# Patient Record
Sex: Female | Born: 1980 | Race: White | Hispanic: No | Marital: Single | Smoking: Former smoker
Health system: Southern US, Community
[De-identification: ages and names within clinical notes are randomized; demographics above are authoritative.]

## PROBLEM LIST (undated history)

## (undated) DIAGNOSIS — F32A Depression, unspecified: Secondary | ICD-10-CM

## (undated) DIAGNOSIS — F329 Major depressive disorder, single episode, unspecified: Secondary | ICD-10-CM

## (undated) DIAGNOSIS — F419 Anxiety disorder, unspecified: Secondary | ICD-10-CM

## (undated) DIAGNOSIS — F319 Bipolar disorder, unspecified: Secondary | ICD-10-CM

## (undated) DIAGNOSIS — F410 Panic disorder [episodic paroxysmal anxiety] without agoraphobia: Secondary | ICD-10-CM

## (undated) HISTORY — PX: TUBAL LIGATION: SHX77

---

## 2009-07-04 ENCOUNTER — Emergency Department (HOSPITAL_BASED_OUTPATIENT_CLINIC_OR_DEPARTMENT_OTHER): Admission: EM | Admit: 2009-07-04 | Discharge: 2009-07-04 | Payer: Self-pay | Admitting: Emergency Medicine

## 2009-07-04 ENCOUNTER — Ambulatory Visit: Payer: Self-pay | Admitting: Diagnostic Radiology

## 2010-01-02 ENCOUNTER — Emergency Department (HOSPITAL_BASED_OUTPATIENT_CLINIC_OR_DEPARTMENT_OTHER): Admission: EM | Admit: 2010-01-02 | Discharge: 2010-01-02 | Payer: Self-pay | Admitting: Emergency Medicine

## 2010-01-24 ENCOUNTER — Emergency Department (HOSPITAL_BASED_OUTPATIENT_CLINIC_OR_DEPARTMENT_OTHER): Admission: EM | Admit: 2010-01-24 | Discharge: 2010-01-24 | Payer: Self-pay | Admitting: Emergency Medicine

## 2010-01-27 ENCOUNTER — Emergency Department (HOSPITAL_BASED_OUTPATIENT_CLINIC_OR_DEPARTMENT_OTHER): Admission: EM | Admit: 2010-01-27 | Discharge: 2010-01-27 | Payer: Self-pay | Admitting: Emergency Medicine

## 2010-03-01 ENCOUNTER — Emergency Department (HOSPITAL_BASED_OUTPATIENT_CLINIC_OR_DEPARTMENT_OTHER): Admission: EM | Admit: 2010-03-01 | Discharge: 2010-03-01 | Payer: Self-pay | Admitting: Emergency Medicine

## 2010-04-08 ENCOUNTER — Emergency Department (HOSPITAL_BASED_OUTPATIENT_CLINIC_OR_DEPARTMENT_OTHER): Admission: EM | Admit: 2010-04-08 | Discharge: 2010-04-08 | Payer: Self-pay | Admitting: Emergency Medicine

## 2010-04-11 ENCOUNTER — Emergency Department (HOSPITAL_BASED_OUTPATIENT_CLINIC_OR_DEPARTMENT_OTHER): Admission: EM | Admit: 2010-04-11 | Discharge: 2010-04-11 | Payer: Self-pay | Admitting: Emergency Medicine

## 2010-07-21 ENCOUNTER — Emergency Department (HOSPITAL_BASED_OUTPATIENT_CLINIC_OR_DEPARTMENT_OTHER)
Admission: EM | Admit: 2010-07-21 | Discharge: 2010-07-21 | Payer: Self-pay | Source: Home / Self Care | Admitting: Emergency Medicine

## 2011-03-14 ENCOUNTER — Emergency Department (HOSPITAL_BASED_OUTPATIENT_CLINIC_OR_DEPARTMENT_OTHER)
Admission: EM | Admit: 2011-03-14 | Discharge: 2011-03-14 | Disposition: A | Discharge status: Home / Self Care | Payer: Medicaid Other | Attending: Emergency Medicine | Admitting: Emergency Medicine

## 2011-03-14 ENCOUNTER — Other Ambulatory Visit: Payer: Self-pay

## 2011-03-14 DIAGNOSIS — R079 Chest pain, unspecified: Secondary | ICD-10-CM | POA: Insufficient documentation

## 2011-03-14 DIAGNOSIS — F17200 Nicotine dependence, unspecified, uncomplicated: Secondary | ICD-10-CM | POA: Insufficient documentation

## 2011-03-14 DIAGNOSIS — F411 Generalized anxiety disorder: Secondary | ICD-10-CM

## 2011-03-14 DIAGNOSIS — F319 Bipolar disorder, unspecified: Secondary | ICD-10-CM | POA: Insufficient documentation

## 2011-03-14 HISTORY — DX: Anxiety disorder, unspecified: F41.9

## 2011-03-14 HISTORY — DX: Major depressive disorder, single episode, unspecified: F32.9

## 2011-03-14 HISTORY — DX: Bipolar disorder, unspecified: F31.9

## 2011-03-14 HISTORY — DX: Depression, unspecified: F32.A

## 2011-03-14 MED ORDER — ALPRAZOLAM 1 MG PO TABS: 1.0000 mg | ORAL_TABLET | Freq: Every evening | ORAL | Status: AC | PRN

## 2011-03-14 NOTE — ED Notes (Signed)
Hx of anxiety and has been treated in [facility] and were released from there 2-3 months ago for family problems; therefore stopped all meds; since then, feels impending doom; some chest pain; anxiety; was taking meds until then; states intermiitent chest pain with increased anxiety

## 2011-03-14 NOTE — ED Provider Notes (Signed)
History     CSN: [id number] Arrival date & time: 03/14/2011 12:20 PM  Chief Complaint  Patient presents with  . Anxiety   HPI Comments: Pt state that she has a history of being on seroquel and xanax, but has not been on them for a couple of months:pt states that she needs to get set back up with [facility]:pt denies si/hi:pt states that she has episodes of cp when she gets the anxiety sensation  Patient is a 30 y.o. female presenting with anxiety. The history is provided by the patient.  Anxiety This is a recurrent problem. The current episode started 1 to 4 weeks ago. The problem occurs 2 to 4 times per day. The problem has been gradually worsening. Associated symptoms include chest pain. Pertinent negatives include no fever, headaches, rash, sore throat, visual change, vomiting or weakness. The symptoms are aggravated by stress. She has tried nothing for the symptoms.    Past Medical History  Diagnosis Date  . Anxiety   . Depression   . Bipolar affective disorder     History reviewed. No pertinent past surgical history.  No family history on file.  History  Substance Use Topics  . Smoking status: Current Everyday Smoker  . Smokeless tobacco: Never Used  . Alcohol Use: No     former heavy drinker at times    OB History    Grav Para Term Preterm Abortions TAB SAB Ect Mult Living                  Review of Systems  Constitutional: Negative for fever.  HENT: Negative for sore throat.   Cardiovascular: Positive for chest pain.  Gastrointestinal: Negative for vomiting.  Skin: Negative for rash.  Neurological: Negative for weakness and headaches.  All other systems reviewed and are negative.    Physical Exam  BP 113/75  Pulse 64  Temp(Src) 98.1 F (36.7 C) (Oral)  Resp 18  SpO2 100%  Physical Exam  Vitals reviewed. Constitutional: She appears well-developed and well-nourished.  HENT:  Head: Normocephalic.  Neck: Normal range of motion.  Cardiovascular: Normal  rate and regular rhythm.   Pulmonary/Chest: Effort normal and breath sounds normal.  Neurological: She is alert.  Skin: Skin is warm and dry.  Psychiatric: Her mood appears anxious.    ED Course  Procedures  MDM Pt not having hi/si:pt is okay to follow up with her pcp for continued treatment:agreed with pt that would give some xanax today, but would not continue to do it      Teressa Lower, NP 03/14/11 1331

## 2011-03-25 NOTE — ED Provider Notes (Signed)
Medical screening examination/treatment/procedure(s) were performed by non-physician practitioner and as supervising physician I was immediately available for consultation/collaboration.   Cyndra Numbers, MD 03/25/11 [phone]

## 2011-06-01 ENCOUNTER — Emergency Department (HOSPITAL_COMMUNITY)
Admission: EM | Admit: 2011-06-01 | Discharge: 2011-06-01 | Disposition: A | Discharge status: Home / Self Care | Payer: Medicaid Other | Attending: Emergency Medicine | Admitting: Emergency Medicine

## 2011-06-01 DIAGNOSIS — F411 Generalized anxiety disorder: Secondary | ICD-10-CM | POA: Insufficient documentation

## 2011-06-01 DIAGNOSIS — G4700 Insomnia, unspecified: Secondary | ICD-10-CM | POA: Insufficient documentation

## 2011-06-01 LAB — BASIC METABOLIC PANEL
Chloride: 103 mEq/L (ref 96–112)
GFR calc Af Amer: >90 mL/min (ref >90)
Potassium: 4.5 mEq/L (ref 3.5–5.1)
Sodium: 137 mEq/L (ref 135–145)

## 2011-06-01 LAB — URINALYSIS, ROUTINE W REFLEX MICROSCOPIC
Glucose, UA: NEGATIVE mg/dL
Ketones, ur: NEGATIVE mg/dL
Protein, ur: NEGATIVE mg/dL

## 2011-06-01 LAB — POCT PREGNANCY, URINE: Preg Test, Ur: NEGATIVE

## 2011-06-01 LAB — DIFFERENTIAL
Basophils Absolute: 0 10*3/uL (ref 0.0–0.1)
Eosinophils Absolute: 0.2 10*3/uL (ref 0.0–0.7)
Lymphs Abs: 1.9 10*3/uL (ref 0.7–4.0)
Neutrophils Relative %: 56 % (ref 43–77)

## 2011-06-01 LAB — URINE MICROSCOPIC-ADD ON

## 2011-06-01 LAB — RAPID URINE DRUG SCREEN, HOSP PERFORMED
Amphetamines: NOT DETECTED
Benzodiazepines: POSITIVE — AB
Opiates: NOT DETECTED

## 2011-06-01 LAB — CBC
MCV: 85.5 fL (ref 78.0–100.0)
Platelets: 202 10*3/uL (ref 150–400)
RBC: 4.47 MIL/uL (ref 3.87–5.11)
WBC: 6.1 10*3/uL (ref 4.0–10.5)

## 2011-06-01 LAB — ETHANOL: Alcohol, Ethyl (B): <11 mg/dL (ref 0–11)

## 2011-09-29 ENCOUNTER — Emergency Department (HOSPITAL_BASED_OUTPATIENT_CLINIC_OR_DEPARTMENT_OTHER)
Admission: EM | Admit: 2011-09-29 | Discharge: 2011-09-29 | Disposition: A | Discharge status: Home / Self Care | Payer: 59 | Attending: Emergency Medicine | Admitting: Emergency Medicine

## 2011-09-29 ENCOUNTER — Encounter (HOSPITAL_BASED_OUTPATIENT_CLINIC_OR_DEPARTMENT_OTHER): Payer: Self-pay | Admitting: *Deleted

## 2011-09-29 DIAGNOSIS — F419 Anxiety disorder, unspecified: Secondary | ICD-10-CM

## 2011-09-29 DIAGNOSIS — F411 Generalized anxiety disorder: Secondary | ICD-10-CM | POA: Insufficient documentation

## 2011-09-29 DIAGNOSIS — F319 Bipolar disorder, unspecified: Secondary | ICD-10-CM | POA: Insufficient documentation

## 2011-09-29 MED ORDER — ALPRAZOLAM 1 MG PO TABS: 1.0000 mg | ORAL_TABLET | Freq: Three times a day (TID) | ORAL | Status: DC | PRN

## 2011-09-29 NOTE — ED Provider Notes (Signed)
History   This chart was scribed for Toy Baker, MD scribed by Magnus Sinning. The patient was seen in room MH12/MH12 seen at 1515     CSN: [id number]  Arrival date & time 09/29/11  1427   First MD Initiated Contact with Patient 09/29/11 1506      Chief Complaint  Patient presents with  . Anxiety    (Consider location/radiation/quality/duration/timing/severity/associated sxs/prior treatment) HPI Christy Baker is a 31 y.o. female who presents to the Emergency Department complaining of constant severe feelings of  anxiety with associated feelings of paranoia,heart palpitations, and insomnia onset a week and a half ago. She reports that it is a 8 out of 10 in quality and denies SI,HI, auditory/ visual hallucinations, or feelings of depression. She says that she is constantly worried that someone will harm her or her children and that something bad will happen. She adds that she use to take 1 mg Xanax about 3 months ago, but stopped taking it. She says that she has decreased her caffeine consumption,but has had one bottle of pepsi and two cups of coke today. Denies any etoh or drug use.   PCP: Does not currently have a PCP.   Past Medical History  Diagnosis Date  . Anxiety   . Depression   . Bipolar affective disorder     History reviewed. No pertinent past surgical history.  History reviewed. No pertinent family history.  History  Substance Use Topics  . Smoking status: Current Everyday Smoker  . Smokeless tobacco: Never Used  . Alcohol Use: No     former heavy drinker at times   Review of Systems  Cardiovascular: Positive for palpitations.  Psychiatric/Behavioral: Negative for suicidal ideas and hallucinations. The patient is nervous/anxious.   All other systems reviewed and are negative.    Allergies  Review of patient's allergies indicates no known allergies.  Home Medications   Current Outpatient Rx  Name Route Sig Dispense Refill  . ALPRAZOLAM 1 MG PO TABS  Oral Take 1 mg by mouth at bedtime as needed.      Marland Kitchen QUETIAPINE FUMARATE 100 MG PO TABS Oral Take 100 mg by mouth at bedtime.        BP 128/77  Pulse 91  Temp(Src) 98.7 F (37.1 C) (Oral)  Resp 18  Ht 5\' 1"  (1.549 m)  Wt 123 lb (55.792 kg)  BMI 23.24 kg/m2  SpO2 100%  LMP 09/13/2011  Physical Exam  Nursing note and vitals reviewed. Constitutional: She is oriented to person, place, and time. She appears well-developed and well-nourished. No distress.  HENT:  Head: Normocephalic and atraumatic.  Eyes: EOM are normal. Pupils are equal, round, and reactive to light.  Neck: Neck supple. No tracheal deviation present.  Cardiovascular: Tachycardia present.   Pulmonary/Chest: Effort normal. No respiratory distress.  Abdominal: Soft. She exhibits no distension.  Musculoskeletal: Normal range of motion. She exhibits no edema.  Neurological: She is alert and oriented to person, place, and time. No sensory deficit.  Skin: Skin is warm and dry.  Psychiatric: Her mood appears anxious. She is not actively hallucinating. She expresses no homicidal and no suicidal ideation.    ED Course  Procedures (including critical care time) DIAGNOSTIC STUDIES: Oxygen Saturation is 100% on room air , normal by my interpretation.    COORDINATION OF CARE: 1525: Physician notifies patient of intent to d/c home with RX of Xanax 1mg . He recommends she contact PCP or [facility]. Patient agrees with plan of  action set at this time.  Labs Reviewed - No data to display No results found.   No diagnosis found.    MDM  I personally performed the services described in this documentation, which was scribed in my presence. The recorded information has been reviewed and considered.  Pt given rx for xanax and willl f/u mental health         Toy Baker, MD 09/29/11 1530

## 2011-09-29 NOTE — ED Notes (Addendum)
Pt states she has been diagnosed with anxiety before, but is not on meds for same. States she will be cleaning up at home and will be worried that her and the children are not safe and that something bad is going to happen. Used to be on Xanax, but no longer is. Unable to sleep.  Appears anxious.

## 2011-09-29 NOTE — Discharge Instructions (Signed)
Anxiety and Panic Attacks Your caregiver has informed you that you are having an anxiety or panic attack. There may be many forms of this. Most of the time these attacks come suddenly and without warning. They come at any time of day, including periods of sleep, and at any time of life. They may be strong and unexplained. Although panic attacks are very scary, they are physically harmless. Sometimes the cause of your anxiety is not known. Anxiety is a protective mechanism of the body in its fight or flight mechanism. Most of these perceived danger situations are actually nonphysical situations (such as anxiety over losing a job). CAUSES  The causes of an anxiety or panic attack are many. Panic attacks may occur in otherwise healthy people given a certain set of circumstances. There may be a genetic cause for panic attacks. Some medications may also have anxiety as a side effect. SYMPTOMS  Some of the most common feelings are:  Intense terror.   Dizziness, feeling faint.   Hot and cold flashes.   Fear of going crazy.   Feelings that nothing is real.   Sweating.   Shaking.   Chest pain or a fast heartbeat (palpitations).   Smothering, choking sensations.   Feelings of impending doom and that death is near.   Tingling of extremities, this may be from over-breathing.   Altered reality (derealization).   Being detached from yourself (depersonalization).  Several symptoms can be present to make up anxiety or panic attacks. DIAGNOSIS  The evaluation by your caregiver will depend on the type of symptoms you are experiencing. The diagnosis of anxiety or panic attack is made when no physical illness can be determined to be a cause of the symptoms. TREATMENT  Treatment to prevent anxiety and panic attacks may include:  Avoidance of circumstances that cause anxiety.   Reassurance and relaxation.   Regular exercise.   Relaxation therapies, such as yoga.   Psychotherapy with a  psychiatrist or therapist.   Avoidance of caffeine, alcohol and illegal drugs.   Prescribed medication.  SEEK IMMEDIATE MEDICAL CARE IF:   You experience panic attack symptoms that are different than your usual symptoms.   You have any worsening or concerning symptoms.  Document Released: 07/23/2005 Document Revised: 04/04/2011 Document Reviewed: 11/24/2009 [facility] Patient Information 2012 [place], [place].  Followup with [facility] mental health Department

## 2013-03-10 ENCOUNTER — Emergency Department (HOSPITAL_BASED_OUTPATIENT_CLINIC_OR_DEPARTMENT_OTHER)
Admission: EM | Admit: 2013-03-10 | Discharge: 2013-03-10 | Disposition: A | Discharge status: Home / Self Care | Payer: Medicaid Other | Attending: Emergency Medicine | Admitting: Emergency Medicine

## 2013-03-10 ENCOUNTER — Encounter (HOSPITAL_BASED_OUTPATIENT_CLINIC_OR_DEPARTMENT_OTHER): Payer: Self-pay | Admitting: *Deleted

## 2013-03-10 DIAGNOSIS — G479 Sleep disorder, unspecified: Secondary | ICD-10-CM | POA: Insufficient documentation

## 2013-03-10 DIAGNOSIS — M549 Dorsalgia, unspecified: Secondary | ICD-10-CM | POA: Insufficient documentation

## 2013-03-10 DIAGNOSIS — G8929 Other chronic pain: Secondary | ICD-10-CM | POA: Insufficient documentation

## 2013-03-10 DIAGNOSIS — F411 Generalized anxiety disorder: Secondary | ICD-10-CM | POA: Insufficient documentation

## 2013-03-10 DIAGNOSIS — Z760 Encounter for issue of repeat prescription: Secondary | ICD-10-CM | POA: Insufficient documentation

## 2013-03-10 DIAGNOSIS — Z79899 Other long term (current) drug therapy: Secondary | ICD-10-CM | POA: Insufficient documentation

## 2013-03-10 DIAGNOSIS — Z87891 Personal history of nicotine dependence: Secondary | ICD-10-CM | POA: Insufficient documentation

## 2013-03-10 DIAGNOSIS — F319 Bipolar disorder, unspecified: Secondary | ICD-10-CM | POA: Insufficient documentation

## 2013-03-10 MED ORDER — TRAMADOL HCL 50 MG PO TABS: 100.0000 mg | ORAL_TABLET | Freq: Every day | ORAL | Status: DC

## 2013-03-10 MED ORDER — ALPRAZOLAM 1 MG PO TABS: 1.0000 mg | ORAL_TABLET | Freq: Three times a day (TID) | ORAL | Status: DC | PRN

## 2013-03-10 NOTE — ED Notes (Signed)
Pt states she is out of medication for her anxiety and also had been taking 100 mg of tramadol for her back issues. She is having trouble sleeping well and is having some anxiety attacks

## 2013-03-17 NOTE — ED Provider Notes (Signed)
  CSN: [id number]     Arrival date & time 03/10/13  1017 History     First MD Initiated Contact with Patient 03/10/13 1046     Chief Complaint  Patient presents with  . needs meds ran out and is having symptoms    (Consider location/radiation/quality/duration/timing/severity/associated sxs/prior Treatment) HPI Comments: Pt is out of her pain meds and anxiety meds, States that her psychiatrist no longer can prescribe meds. She has been having some sleeping difficulty and anxiety attacks. Also on tramadol for chronic back pain  The history is provided by the patient.    Past Medical History  Diagnosis Date  . Anxiety   . Depression   . Bipolar affective disorder    History reviewed. No pertinent past surgical history. History reviewed. No pertinent family history. History  Substance Use Topics  . Smoking status: Former Games developer  . Smokeless tobacco: Never Used  . Alcohol Use: No     Comment: former heavy drinker at times   OB History   Grav Para Term Preterm Abortions TAB SAB Ect Mult Living                 Review of Systems  Constitutional: Negative for activity change.  Musculoskeletal: Positive for back pain.  Psychiatric/Behavioral: The patient is nervous/anxious.     Allergies  Review of patient's allergies indicates no known allergies.  Home Medications   Current Outpatient Rx  Name  Route  Sig  Dispense  Refill  . traMADol (ULTRAM-ER) 100 MG 24 hr tablet   Oral   Take 100 mg by mouth daily.         Marland Kitchen ALPRAZolam (XANAX) 1 MG tablet   Oral   Take 1 tablet (1 mg total) by mouth 3 (three) times daily as needed.   15 tablet   0   . ALPRAZolam (XANAX) 1 MG tablet   Oral   Take 1 tablet (1 mg total) by mouth 3 (three) times daily as needed for sleep or anxiety.   30 tablet   0   . QUEtiapine (SEROQUEL) 100 MG tablet   Oral   Take 100 mg by mouth at bedtime.           . traMADol (ULTRAM) 50 MG tablet   Oral   Take 2 tablets (100 mg total) by mouth  daily.   30 tablet   0    BP 119/79  Pulse 78  Temp(Src) 97.9 F (36.6 C) (Oral)  Resp 24  Ht 5' (1.524 m)  Wt 142 lb (64.411 kg)  BMI 27.73 kg/m2  SpO2 100%  LMP 02/14/2013 Physical Exam  Nursing note and vitals reviewed. Constitutional: She is oriented to person, place, and time. She appears well-developed.  HENT:  Head: Normocephalic and atraumatic.  Neck: Neck supple.  Pulmonary/Chest: Effort normal.  Neurological: She is alert and oriented to person, place, and time.  Skin: Skin is warm.    ED Course   Procedures (including critical care time)  Labs Reviewed - No data to display No results found. 1. Medication refill     MDM  Pt comes in for med refill. She is not a frequent flyer to the ER for the same, and appears very reasonable. Will give her 1-2 weeks worth of supply. She is in the process of finding another psychiatrist.  Derwood Kaplan, MD 03/17/13 [phone]

## 2013-10-19 ENCOUNTER — Encounter (HOSPITAL_BASED_OUTPATIENT_CLINIC_OR_DEPARTMENT_OTHER): Payer: Self-pay | Admitting: Emergency Medicine

## 2013-10-19 ENCOUNTER — Emergency Department (HOSPITAL_BASED_OUTPATIENT_CLINIC_OR_DEPARTMENT_OTHER)
Admission: EM | Admit: 2013-10-19 | Discharge: 2013-10-19 | Disposition: A | Discharge status: Home / Self Care | Payer: MEDICAID | Attending: Emergency Medicine | Admitting: Emergency Medicine

## 2013-10-19 DIAGNOSIS — Z3202 Encounter for pregnancy test, result negative: Secondary | ICD-10-CM | POA: Insufficient documentation

## 2013-10-19 DIAGNOSIS — F319 Bipolar disorder, unspecified: Secondary | ICD-10-CM | POA: Insufficient documentation

## 2013-10-19 DIAGNOSIS — Z79899 Other long term (current) drug therapy: Secondary | ICD-10-CM | POA: Insufficient documentation

## 2013-10-19 DIAGNOSIS — G479 Sleep disorder, unspecified: Secondary | ICD-10-CM | POA: Diagnosis not present

## 2013-10-19 DIAGNOSIS — F410 Panic disorder [episodic paroxysmal anxiety] without agoraphobia: Secondary | ICD-10-CM | POA: Diagnosis not present

## 2013-10-19 DIAGNOSIS — F419 Anxiety disorder, unspecified: Secondary | ICD-10-CM

## 2013-10-19 HISTORY — DX: Panic disorder (episodic paroxysmal anxiety): F41.0

## 2013-10-19 LAB — URINALYSIS, ROUTINE W REFLEX MICROSCOPIC
BILIRUBIN URINE: NEGATIVE
GLUCOSE, UA: NEGATIVE mg/dL
KETONES UR: NEGATIVE mg/dL
Leukocytes, UA: NEGATIVE
Nitrite: NEGATIVE
PROTEIN: NEGATIVE mg/dL
Specific Gravity, Urine: 1.02 (ref 1.005–1.030)
Urobilinogen, UA: 0.2 mg/dL (ref 0.0–1.0)
pH: 5.5 (ref 5.0–8.0)

## 2013-10-19 LAB — URINE MICROSCOPIC-ADD ON

## 2013-10-19 LAB — PREGNANCY, URINE: PREG TEST UR: NEGATIVE

## 2013-10-19 MED ORDER — ALPRAZOLAM 1 MG PO TABS: 1.0000 mg | ORAL_TABLET | Freq: Three times a day (TID) | ORAL | Status: DC | PRN

## 2013-10-19 NOTE — ED Provider Notes (Signed)
CSN: [id number]     Arrival date & time 10/19/13  0840 History   First MD Initiated Contact with Patient 10/19/13 [phone]     Chief Complaint  Patient presents with  . Panic Attack     (Consider location/radiation/quality/duration/timing/severity/associated sxs/prior Treatment) HPI Comments: Pt states that she has a history of anxiety and she is out of her medication. Pt states that when she has flare up she is not able to sleep. Pt states that things get overwhelming for her. Denies si/hi and has no history of either. Pt states that she doesn't have a pcp. Pt states that when she has the panic attacks she has cp and her palms get sweaty. Pt denies cp at this time  The history is provided by the patient. No language interpreter was used.    Past Medical History  Diagnosis Date  . Anxiety   . Depression   . Bipolar affective disorder   . Panic attacks    Past Surgical History  Procedure Laterality Date  . Cesarean section      x 2  . Tubal ligation     No family history on file. History  Substance Use Topics  . Smoking status: Never Smoker   . Smokeless tobacco: Never Used  . 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   Cardiovascular: Positive for chest pain.  Psychiatric/Behavioral: Positive for sleep disturbance. Negative for suicidal ideas.      Allergies  Ibuprofen  Home Medications   Current Outpatient Rx  Name  Route  Sig  Dispense  Refill  . ALPRAZolam (XANAX) 1 MG tablet   Oral   Take 1 tablet (1 mg total) by mouth 3 (three) times daily as needed.   15 tablet   0   . ALPRAZolam (XANAX) 1 MG tablet   Oral   Take 1 tablet (1 mg total) by mouth 3 (three) times daily as needed for sleep or anxiety.   30 tablet   0   . QUEtiapine (SEROQUEL) 100 MG tablet   Oral   Take 100 mg by mouth at bedtime.            . traMADol (ULTRAM) 50 MG tablet   Oral   Take 2 tablets (100 mg total) by mouth daily.   30 tablet   0   . traMADol (ULTRAM-ER) 100 MG 24 hr tablet   Oral   Take 100 mg by mouth daily.          BP 140/93  Temp(Src) 98.7 F (37.1 C) (Oral)  Resp 20  Ht 5\' 1"  (1.549 m)  Wt 145 lb (65.772 kg)  BMI 27.41 kg/m2  SpO2 100%  LMP 09/21/2013 Physical Exam  Nursing note and vitals reviewed. Constitutional: She appears well-developed and well-nourished.  Cardiovascular: Normal rate and regular rhythm.   Pulmonary/Chest: Effort normal.  Musculoskeletal: Normal range of motion.  Neurological: She is alert. Coordination normal.  Skin: Skin is dry.  Psychiatric: Her mood appears anxious. She expresses no homicidal ideation. She expresses no suicidal plans.    ED Course  Procedures (including critical care time) Labs Review Labs Reviewed  URINALYSIS, ROUTINE W REFLEX MICROSCOPIC - Abnormal; Notable for the following:    Hgb urine dipstick TRACE (*)  All other components within normal limits  URINE MICROSCOPIC-ADD ON - Abnormal; Notable for the following:    Bacteria, UA FEW (*)    All other components within normal limits  PREGNANCY, URINE   Imaging Review No results found.   EKG Interpretation   Date/Time:  Monday October 19 2013 09:48:14 EDT Ventricular Rate:  73 PR Interval:  166 QRS Duration: 76 QT Interval:  388 QTC Calculation: 427 R Axis:   32 Text Interpretation:  Normal sinus rhythm with sinus arrhythmia Normal ECG  No significant change since last tracing Confirmed by ALLEN  MD, ANTHONY  ([id number]) on 10/19/2013 9:54:10 AM      MDM   Final diagnoses:  Anxiety   Pt without si/hi/ pt given resource guide and xanax as explained pt needs to find pcp    Teressa Lower, NP 10/19/13 1023

## 2013-10-19 NOTE — ED Notes (Addendum)
Patient states she has a two year history of panic attacks and anxiety.  States she has had an increase in her anxiety over the last two weeks.  Denies any situations that have made her anxiety worse, states "it just gets worse".  States her anxiety is associated with paranoia, chest pain, sweats and insomnia.  Denies suicidal or homicidal ideations.

## 2013-10-19 NOTE — ED Notes (Signed)
Pt requests "xanax or ativan or klonopin". Pt states "I've tried everything else and nothing else works!"

## 2013-10-19 NOTE — Discharge Instructions (Signed)
Panic Attacks °Panic attacks are sudden, short-lived surges of severe anxiety, fear, or discomfort. They may occur for no reason when you are relaxed, when you are anxious, or when you are sleeping. Panic attacks may occur for a number of reasons:  °· Healthy people occasionally have panic attacks in extreme, life-threatening situations, such as war or natural disasters. Normal anxiety is a protective mechanism of the body that helps us react to danger (fight or flight response). °· Panic attacks are often seen with anxiety disorders, such as panic disorder, social anxiety disorder, generalized anxiety disorder, and phobias. Anxiety disorders cause excessive or uncontrollable anxiety. They may interfere with your relationships or other life activities. °· Panic attacks are sometimes seen with other mental illnesses such as depression and posttraumatic stress disorder. °· Certain medical conditions, prescription medicines, and drugs of abuse can cause panic attacks. °SYMPTOMS  °Panic attacks start suddenly, peak within 20 minutes, and are accompanied by four or more of the following symptoms: °· Pounding heart or fast heart rate (palpitations). °· Sweating. °· Trembling or shaking. °· Shortness of breath or feeling smothered. °· Feeling choked. °· Chest pain or discomfort. °· Nausea or strange feeling in your stomach. °· Dizziness, lightheadedness, or feeling like you will faint. °· Chills or hot flushes. °· Numbness or tingling in your lips or hands and feet. °· Feeling that things are not real or feeling that you are not yourself. °· Fear of losing control or going crazy. °· Fear of dying. °Some of these symptoms can mimic serious medical conditions. For example, you may think you are having a heart attack. Although panic attacks can be very scary, they are not life threatening. °DIAGNOSIS  °Panic attacks are diagnosed through an assessment by your health care provider. Your health care provider will ask questions  about your symptoms, such as where and when they occurred. Your health care provider will also ask about your medical history and use of alcohol and drugs, including prescription medicines. Your health care provider may order blood tests or other studies to rule out a serious medical condition. Your health care provider may refer you to a mental health professional for further evaluation. °TREATMENT  °· Most healthy people who have one or two panic attacks in an extreme, life-threatening situation will not require treatment. °· The treatment for panic attacks associated with anxiety disorders or other mental illness typically involves counseling with a mental health professional, medicine, or a combination of both. Your health care provider will help determine what treatment is best for you. °· Panic attacks due to physical illness usually goes away with treatment of the illness. If prescription medicine is causing panic attacks, talk with your health care provider about stopping the medicine, decreasing the dose, or substituting another medicine. °· Panic attacks due to alcohol or drug abuse goes away with abstinence. Some adults need professional help in order to stop drinking or using drugs. °HOME CARE INSTRUCTIONS  °· Take all your medicines as prescribed.   °· Check with your health care provider before starting new prescription or over-the-counter medicines. °· Keep all follow up appointments with your health care provider. °SEEK MEDICAL CARE IF: °· You are not able to take your medicines as prescribed. °· Your symptoms do not improve or get worse. °SEEK IMMEDIATE MEDICAL CARE IF:  °· You experience panic attack symptoms that are different than your usual symptoms. °· You have serious thoughts about hurting yourself or others. °· You are taking medicine for panic attacks and   have a serious side effect. °MAKE SURE YOU: °· Understand these instructions. °· Will watch your condition. °· Will get help right away  if you are not doing well or get worse. °Document Released: 07/23/2005 Document Revised: 05/13/2013 Document Reviewed: 03/06/2013 °ExitCare® Patient Information ©2014 [employer]. ° ° ° °Emergency Department Resource Guide °1) Find a Doctor and Pay Out of Pocket °Although you won't have to find out who is covered by your insurance plan, it is a good idea to ask around and get recommendations. You will then need to call the office and see if the doctor you have chosen will accept you as a new patient and what types of options they offer for patients who are self-pay. Some doctors offer discounts or will set up payment plans for their patients who do not have insurance, but you will need to ask so you aren't surprised when you get to your appointment. ° °2) Contact Your Local Health Department °Not all health departments have doctors that can see patients for sick visits, but many do, so it is worth a call to see if yours does. If you don't know where your local health department is, you can check in your phone book. The CDC also has a tool to help you locate your state's health department, and many state websites also have listings of all of their local health departments. ° °3) Find a Walk-in Clinic °If your illness is not likely to be very severe or complicated, you may want to try a walk in clinic. These are popping up all over the country in pharmacies, drugstores, and shopping centers. They're usually staffed by nurse practitioners or physician assistants that have been trained to treat common illnesses and complaints. They're usually fairly quick and inexpensive. However, if you have serious medical issues or chronic medical problems, these are probably not your best option. ° °No Primary Care Doctor: °- Call [facility] at  832-8000 - they can help you locate a primary care doctor that  accepts your insurance, provides certain services, etc. °- Physician Referral Service- 1-800-533-3463 ° °Chronic Pain  Problems: °Organization         Address  Phone   Notes  ° [facility]  (336) 297-2271 Patients need to be referred by their primary care doctor.  ° °Medication Assistance: °Organization         Address  Phone   Notes  °[place] [facility] [address] °[place], [place] [address] (336) 641-8030 --Must be a resident of [place] °-- Must have NO insurance coverage whatsoever (no Medicaid/ Medicare, etc.) °-- The pt. MUST have a primary care doctor that directs their care regularly and follows them in the community °  °[facility]  (866) 331-1348   °[employer]  (888) 892-1162   ° °Agencies that provide inexpensive medical care: °Organization         Address  Phone   Notes  °[facility]  (336) 832-8035   °[facility]    (336) 832-7272   °[facility] [address] °[place],  [address] (336) 832-4777   °[facility] [address], °[place] (336) 271-4999   °[facility]    (336) 373-0678   °[facility]    (336) 272-1050   °[facility] ° [address], [place] Phone:  (336) 832-4444, Fax:  (336) 832-4440 Hours of Operation:  9 am -   6 pm, M-F.  Also accepts Medicaid/Medicare and self-pay.  °[facility] ° [address], [place] Phone: (336) 832-3150, Fax: (336) [phone]. Hours of Operation:  8:30 am - 5:30 pm, M-F.  Also accepts Medicaid and self-pay.  °[facility] [place] [address], [place] Phone: (336) 878-6027   °[facility] [address], [place], [place] (336)723-1848, Ext. 123 Mondays & Thursdays: 7-9 AM.  First 15 patients are seen on a first come, first serve basis. °  ° °Medicaid-accepting [place] Providers: ° °Organization         Address  Phone   Notes  °[facility] [address], [place] (336) 641-2100 Also  accepts self-pay patients.  °[facility] [address], [place] ° (336) 856-9996   °[facility] [address], [place] (336) 288-8857   °[facility] [address] [place] [address], [place] (336) 299-7000   °Veita Bland [address], [place]  ° (336) 373-1557 Only accepts [place] Access Medicaid patients after they have their name applied to their card.  ° °Self-Pay (no insurance) in [place]: ° °Organization         Address  Phone   Notes  °Sickle Cell Patients, [facility] [address], [place] (336) 832-1970   °[facility] [address], [place] (336) 832-4400   °[facility] ° [address], [place] (336) 992-4800   °[facility]/Dr. Osei-Bonsu ° [address] [place] [address], [place] or [address], [place] (336) 841-8500 Phone number for both [place] and [place] locations is the same.  °[facility] [address], [place] (336) 299-0000   °[facility] [address] [place] [address], [place] or [address] (336) 852-7530 °(336) 878-2260   °[facility] [address], [place] (336) 350-1642, phone; (336) 294-5005, fax Sees patients 1st and 3rd Saturday of every month.  Must not qualify for public or private insurance (i.e. Medicaid, Medicare, [place] Health Choice, Veterans' Benefits) • Household income should be no more than 200% of the poverty level •The clinic cannot treat you if you are pregnant or think you are pregnant • Sexually transmitted diseases are not treated at the clinic.  ° ° °Dental Care: °Organization         Address  Phone  Notes  °[facility] [address], [place] (336) 641-6152 Accepts children up to age 21 who are enrolled in Medicaid or [place] Health Choice; pregnant  women with a Medicaid card; and children who have applied for Medicaid or [place] Health Choice, but were declined, whose parents can pay a reduced fee at time of service.  °[facility] [place]  [address], [place] (336) 641-7733 Accepts children up to age 21 who are enrolled in Medicaid or [place] Health Choice; pregnant women with a Medicaid card; and children who have applied for Medicaid or [place] Health Choice, but were declined, whose parents can pay a reduced fee at time of service.  °[facility] ° [address], [place] (336) 641-4533 Patients are seen by appointment only. Walk-ins are not accepted. [facility] will see patients 18 years of age and   older. °Monday - Tuesday (8am-5pm) °Most Wednesdays (8:30-5pm) °$30 per visit, cash only  °[facility] ° [address], [place] (336) 641-4533 Patients are seen by appointment only. Walk-ins are not accepted. [facility] will see patients 18 years of age and older. °One Wednesday Evening (Monthly: Volunteer Based).  $30 per visit, cash only  °[facility]  (919) 537-3737 for adults; Children under age 4, call Graduate Pediatric Dentistry at ([phone]) [phone]. Children aged 4-14, please call (919) [phone] to request a pediatric application. ° Dental services are provided in all areas of dental care including fillings, crowns and bridges, complete and partial dentures, implants, gum treatment, root canals, and extractions. Preventive care is also provided. Treatment is provided to both adults and children. °Patients are selected via a lottery and there is often a waiting list. °  °[facility] [address], °[place] ° (336) 763-8833 [email or web address] °  °[facility] [address], [place], [place] (336)723-1848, Ext. 123 Second and Fourth Thursday of each month, opens at 6:30 AM; Clinic ends at 9 AM.  Patients are  seen on a first-come first-served basis, and a limited number are seen during each clinic.  ° °[facility] ° [address], [place], [place] (336) 723-7904   Eligibility Requirements °You must have lived in [place], [place], or [place] counties for at least the last three months. °  You cannot be eligible for state or federal sponsored healthcare insurance, including Veterans Administration, Medicaid, or Medicare. °  You generally cannot be eligible for healthcare insurance through your employer.  °  How to apply: °Eligibility screenings are held every Tuesday and Wednesday afternoon from 1:00 pm until 4:00 pm. You do not need an appointment for the interview!  °[facility] [address], [place], [place] 336-631-2330   °[facility]  336-342-8273   °[facility]  336-703-3100   °[facility]  336-570-6415   ° °Behavioral Health Resources in the Community: °Intensive Outpatient Programs °Organization         Address  Phone  Notes  °[facility] [address], [place], [place] 336-878-6098   °[facility] [address], [place], [place] 336-832-9800   °[facility] [address], [place], [place] ° 336-882-2125   °[facility] [address],  °[place], [place] 1-800-853-5163 or 336-641-4981   °Substance Abuse Resources °Organization         Address  Phone  Notes  °[facility]  336-882-2125   °[facility]  336-784-9470   °[facility]  336-285-9073   °[facility]  336-845-3988   °Residential & Outpatient Substance Abuse Program  1-800-659-3381   °Psychological Services °Organization         Address  Phone  Notes  °[facility]  336- 832-9600   °[facility]  336- 378-7881   °[facility] [address], [place] 1-800-853-5163 or 336-641-4981   ° °Mobile Crisis  Teams °Organization         Address  Phone  Notes  °[facility], Mobile Crisis Care Unit  1-877-626-1772   °Assertive °[facility] ° [address] [place], [place] 336-834-9664   °Sharon DeEsch [address] ° [place] 336-554-5454   ° °Self-Help/Support Groups °Organization         Address    Phone             Notes  °[facility] - variety of support groups  336- 373-1402 Call for more information  °Narcotics Anonymous (NA), [facility] [address], °[place]  2 meetings at this location  ° °Residential Treatment Programs °Organization         Address  Phone  Notes  °[facility] [address],    °[place]  1-866-801-8205   °[facility] ° [address], [place], [place] 704-293-8524   °[facility] [address], [place] 336-845-3988 Admissions: 8am-3pm M-F  °[facility] [address]    °[place], [place] 336-841-1104   °[facility] [address], [place], [place] 336-379-7146   °[facility] [address]  °[place], [place] 336-285-9073   °[facility] - Intensive Outpatient [address], [place], [place] 336-852-3033   °[facility] [address]  °[place], [place] 1-877-615-2722 or 336-784-9470   °[facility] [address] [place], [place] 336-227-7417 Accepts Medicaid  °[facility] [address]  °[place] 1-800-659-3381 Substance Abuse/Addiction Treatment  ° °[place] Behavioral Health Resources °Organization         Address  Phone  Notes  °[facility]  (888) 581-9988   °Julie Brannon, PhD [address] [place], [place]   (336) 349-5553 or (336) 951-0000   °[facility]   [address] °[place], [place] (336) 349-4454   °[facility] [address], [place], [place] (336) 342-8316  Insurance/Medicaid/sponsorship through [facility]  °[facility] [address]                                    [place], [place] (336) 342-8316 Therapy/tele-psych/case  °[facility] [address]  ° [place], [place] (336) 349-2233    °Dr. Arfeen  (336) 349-4544   °[facility] [place]  [employer] [place] [facility] 1) [address], [place] °2) [address], [place] °3)  [address], [place] (336) 349-3220 °(336) 342-7768 ° °(336) 342-8140   °[place] [facility] (336) 342-1394 or (336) 342-3537 (After Hours)    ° ° ° °

## 2013-10-20 NOTE — ED Provider Notes (Signed)
Medical screening examination/treatment/procedure(s) were performed by non-physician practitioner and as supervising physician I was immediately available for consultation/collaboration.  Jasmene Goswami T Hena Ewalt, MD 10/20/13 1508 

## 2013-12-21 ENCOUNTER — Encounter (HOSPITAL_BASED_OUTPATIENT_CLINIC_OR_DEPARTMENT_OTHER): Payer: Self-pay | Admitting: Emergency Medicine

## 2013-12-21 ENCOUNTER — Emergency Department (HOSPITAL_BASED_OUTPATIENT_CLINIC_OR_DEPARTMENT_OTHER)
Admission: EM | Admit: 2013-12-21 | Discharge: 2013-12-21 | Disposition: A | Discharge status: Home / Self Care | Payer: MEDICAID | Attending: Emergency Medicine | Admitting: Emergency Medicine

## 2013-12-21 DIAGNOSIS — Z79899 Other long term (current) drug therapy: Secondary | ICD-10-CM | POA: Insufficient documentation

## 2013-12-21 DIAGNOSIS — F411 Generalized anxiety disorder: Secondary | ICD-10-CM | POA: Insufficient documentation

## 2013-12-21 DIAGNOSIS — F419 Anxiety disorder, unspecified: Secondary | ICD-10-CM

## 2013-12-21 DIAGNOSIS — F319 Bipolar disorder, unspecified: Secondary | ICD-10-CM | POA: Diagnosis not present

## 2013-12-21 DIAGNOSIS — F410 Panic disorder [episodic paroxysmal anxiety] without agoraphobia: Secondary | ICD-10-CM | POA: Diagnosis present

## 2013-12-21 NOTE — ED Notes (Addendum)
Pt c/o " panic attacks" x 3 days requesting refill on xanax

## 2013-12-21 NOTE — ED Provider Notes (Signed)
Medical screening examination/treatment/procedure(s) were performed by non-physician practitioner and as supervising physician I was immediately available for consultation/collaboration.   EKG Interpretation None        Charles B. Sheldon, MD 12/21/13 1507 

## 2013-12-21 NOTE — Discharge Instructions (Signed)
Panic Attacks °Panic attacks are sudden, short-lived surges of severe anxiety, fear, or discomfort. They may occur for no reason when you are relaxed, when you are anxious, or when you are sleeping. Panic attacks may occur for a number of reasons:  °· Healthy people occasionally have panic attacks in extreme, life-threatening situations, such as war or natural disasters. Normal anxiety is a protective mechanism of the body that helps us react to danger (fight or flight response). °· Panic attacks are often seen with anxiety disorders, such as panic disorder, social anxiety disorder, generalized anxiety disorder, and phobias. Anxiety disorders cause excessive or uncontrollable anxiety. They may interfere with your relationships or other life activities. °· Panic attacks are sometimes seen with other mental illnesses such as depression and posttraumatic stress disorder. °· Certain medical conditions, prescription medicines, and drugs of abuse can cause panic attacks. °SYMPTOMS  °Panic attacks start suddenly, peak within 20 minutes, and are accompanied by four or more of the following symptoms: °· Pounding heart or fast heart rate (palpitations). °· Sweating. °· Trembling or shaking. °· Shortness of breath or feeling smothered. °· Feeling choked. °· Chest pain or discomfort. °· Nausea or strange feeling in your stomach. °· Dizziness, lightheadedness, or feeling like you will faint. °· Chills or hot flushes. °· Numbness or tingling in your lips or hands and feet. °· Feeling that things are not real or feeling that you are not yourself. °· Fear of losing control or going crazy. °· Fear of dying. °Some of these symptoms can mimic serious medical conditions. For example, you may think you are having a heart attack. Although panic attacks can be very scary, they are not life threatening. °DIAGNOSIS  °Panic attacks are diagnosed through an assessment by your health care provider. Your health care provider will ask questions  about your symptoms, such as where and when they occurred. Your health care provider will also ask about your medical history and use of alcohol and drugs, including prescription medicines. Your health care provider may order blood tests or other studies to rule out a serious medical condition. Your health care provider may refer you to a mental health professional for further evaluation. °TREATMENT  °· Most healthy people who have one or two panic attacks in an extreme, life-threatening situation will not require treatment. °· The treatment for panic attacks associated with anxiety disorders or other mental illness typically involves counseling with a mental health professional, medicine, or a combination of both. Your health care provider will help determine what treatment is best for you. °· Panic attacks due to physical illness usually goes away with treatment of the illness. If prescription medicine is causing panic attacks, talk with your health care provider about stopping the medicine, decreasing the dose, or substituting another medicine. °· Panic attacks due to alcohol or drug abuse goes away with abstinence. Some adults need professional help in order to stop drinking or using drugs. °HOME CARE INSTRUCTIONS  °· Take all your medicines as prescribed.   °· Check with your health care provider before starting new prescription or over-the-counter medicines. °· Keep all follow up appointments with your health care provider. °SEEK MEDICAL CARE IF: °· You are not able to take your medicines as prescribed. °· Your symptoms do not improve or get worse. °SEEK IMMEDIATE MEDICAL CARE IF:  °· You experience panic attack symptoms that are different than your usual symptoms. °· You have serious thoughts about hurting yourself or others. °· You are taking medicine for panic attacks and   have a serious side effect. °MAKE SURE YOU: °· Understand these instructions. °· Will watch your condition. °· Will get help right away  if you are not doing well or get worse. °Document Released: 07/23/2005 Document Revised: 05/13/2013 Document Reviewed: 03/06/2013 °ExitCare® Patient Information ©2014 [employer]. ° ° ° °Emergency Department Resource Guide °1) Find a Doctor and Pay Out of Pocket °Although you won't have to find out who is covered by your insurance plan, it is a good idea to ask around and get recommendations. You will then need to call the office and see if the doctor you have chosen will accept you as a new patient and what types of options they offer for patients who are self-pay. Some doctors offer discounts or will set up payment plans for their patients who do not have insurance, but you will need to ask so you aren't surprised when you get to your appointment. ° °2) Contact Your Local Health Department °Not all health departments have doctors that can see patients for sick visits, but many do, so it is worth a call to see if yours does. If you don't know where your local health department is, you can check in your phone book. The CDC also has a tool to help you locate your state's health department, and many state websites also have listings of all of their local health departments. ° °3) Find a Walk-in Clinic °If your illness is not likely to be very severe or complicated, you may want to try a walk in clinic. These are popping up all over the country in pharmacies, drugstores, and shopping centers. They're usually staffed by nurse practitioners or physician assistants that have been trained to treat common illnesses and complaints. They're usually fairly quick and inexpensive. However, if you have serious medical issues or chronic medical problems, these are probably not your best option. ° °No Primary Care Doctor: °- Call [facility] at  832-8000 - they can help you locate a primary care doctor that  accepts your insurance, provides certain services, etc. °- Physician Referral Service- 1-800-533-3463 ° °Chronic Pain  Problems: °Organization         Address  Phone   Notes  °[facility]  (336) 297-2271 Patients need to be referred by their primary care doctor.  ° °Medication Assistance: °Organization         Address  Phone   Notes  °[place] [facility] [address] °[place], [place] [address] (336) 641-8030 --Must be a resident of [place] °-- Must have NO insurance coverage whatsoever (no Medicaid/ Medicare, etc.) °-- The pt. MUST have a primary care doctor that directs their care regularly and follows them in the community °  °[facility]  (866) 331-1348   °[employer]  (888) 892-1162   ° °Agencies that provide inexpensive medical care: °Organization         Address  Phone   Notes  °[facility]  (336) 832-8035   °[facility]    (336) 832-7272   °[facility] [address] °[place], [place] [address] (336) 832-4777   °[facility] [address], °[place] (336) 271-4999   °[facility]    (336) 373-0678   °[facility]    (336) 272-1050   °[facility] ° [address], [place] Phone:  (336) 832-4444, Fax:  (336) 832-4440 Hours of Operation:  9 am -   6 pm, M-F.  Also accepts Medicaid/Medicare and self-pay.  °[facility] ° [address], [place] Phone: (336) 832-3150, Fax: (336) 832-3151. Hours of Operation:  8:30 am - 5:30 pm, M-F.  Also accepts Medicaid and self-pay.  °[facility] [place] [address], [place] Phone: (336) 878-6027   °[facility] [address], [place], [place] (336)723-1848, Ext. 123 Mondays & Thursdays: 7-9 AM.  First 15 patients are seen on a first come, first serve basis. °  ° °Medicaid-accepting [place] Providers: ° °Organization         Address  Phone   Notes  °[facility] [address], [place] (336) 641-2100 Also  accepts self-pay patients.  °[facility] [address], [place] ° (336) 856-9996   °[facility] [address], [place] (336) 288-8857   °[facility] [address] [place] [address], [place] (336) 299-7000   °Veita Bland [address], [place]  ° (336) 373-1557 Only accepts [place] Access Medicaid patients after they have their name applied to their card.  ° °Self-Pay (no insurance) in [place]: ° °Organization         Address  Phone   Notes  °Sickle Cell Patients, [facility] [address], [place] (336) 832-1970   °[facility] [address], [place] (336) 832-4400   °[facility] ° [address], [place] (336) 992-4800   °[facility]/Dr. Osei-Bonsu ° [address] [place] [address], [place] or [address], [place] (336) 841-8500 Phone number for both [place] and [place] locations is the same.  °[facility] [address], [place] (336) 299-0000   °[facility] [address] [place] [address], [place] or [address] (336) 852-7530 °(336) 878-2260   °[facility] [address], [place] (336) 350-1642, phone; (336) 294-5005, fax Sees patients 1st and 3rd Saturday of every month.  Must not qualify for public or private insurance (i.e. Medicaid, Medicare, [place] Health Choice, Veterans' Benefits) • Household income should be no more than 200% of the poverty level •The clinic cannot treat you if you are pregnant or think you are pregnant • Sexually transmitted diseases are not treated at the clinic.  ° ° °Dental Care: °Organization         Address  Phone  Notes  °[facility] [address], [place] (336) 641-6152 Accepts children up to age 21 who are enrolled in Medicaid or [place] Health Choice; pregnant  women with a Medicaid card; and children who have applied for Medicaid or [place] Health Choice, but were declined, whose parents can pay a reduced fee at time of service.  °[facility] [place]  [address], [place] (336) 641-7733 Accepts children up to age 21 who are enrolled in Medicaid or [place] Health Choice; pregnant women with a Medicaid card; and children who have applied for Medicaid or [place] Health Choice, but were declined, whose parents can pay a reduced fee at time of service.  °[facility] ° [address], [place] (336) 641-4533 Patients are seen by appointment only. Walk-ins are not accepted. [facility] will see patients 18 years of age and   older. °Monday - Tuesday (8am-5pm) °Most Wednesdays (8:30-5pm) °$30 per visit, cash only  °[facility] ° [address], [place] (336) 641-4533 Patients are seen by appointment only. Walk-ins are not accepted. [facility] will see patients 18 years of age and older. °One Wednesday Evening (Monthly: Volunteer Based).  $30 per visit, cash only  °[facility]  (919) 537-3737 for adults; Children under age 4, call Graduate Pediatric Dentistry at (919) [phone]. Children aged 4-14, please call (919) [phone] to request a pediatric application. ° Dental services are provided in all areas of dental care including fillings, crowns and bridges, complete and partial dentures, implants, gum treatment, root canals, and extractions. Preventive care is also provided. Treatment is provided to both adults and children. °Patients are selected via a lottery and there is often a waiting list. °  °[facility] [address], °[place] ° (336) 763-8833 [email or web address] °  °[facility] [address], [place], [place] (336)723-1848, Ext. 123 Second and Fourth Thursday of each month, opens at 6:30 AM; Clinic ends at 9 AM.  Patients are  seen on a first-come first-served basis, and a limited number are seen during each clinic.  ° °[facility] ° [address], [place], [place] (336) 723-7904   Eligibility Requirements °You must have lived in [place], [place], or [place] counties for at least the last three months. °  You cannot be eligible for state or federal sponsored healthcare insurance, including Veterans Administration, Medicaid, or Medicare. °  You generally cannot be eligible for healthcare insurance through your employer.  °  How to apply: °Eligibility screenings are held every Tuesday and Wednesday afternoon from 1:00 pm until 4:00 pm. You do not need an appointment for the interview!  °[facility] [address], [place], [place] 336-631-2330   °[facility]  336-342-8273   °[facility]  336-703-3100   °[facility]  336-570-6415   ° °Behavioral Health Resources in the Community: °Intensive Outpatient Programs °Organization         Address  Phone  Notes  °[facility] [address], [place], [place] 336-878-6098   °[facility] [address], [place], [place] 336-832-9800   °[facility] [address], [place], [place] ° 336-882-2125   °[facility] [address],  °[place], [place] 1-800-853-5163 or 336-641-4981   °Substance Abuse Resources °Organization         Address  Phone  Notes  °[facility]  336-882-2125   °[facility]  336-784-9470   °[facility]  336-285-9073   °[facility]  336-845-3988   °Residential & Outpatient Substance Abuse Program  1-800-659-3381   °Psychological Services °Organization         Address  Phone  Notes  ° Health  336- 832-9600   °[facility]  336- 378-7881   °[facility] [address], [place] 1-800-853-5163 or 336-641-4981   ° °Mobile Crisis  Teams °Organization         Address  Phone  Notes  °[facility], Mobile Crisis Care Unit  1-877-626-1772   °Assertive °[facility] ° [address] [place], [place] 336-834-9664   °Sharon DeEsch [address] °[place]  336-554-5454   ° °Self-Help/Support Groups °Organization         Address    Phone             Notes  °[facility] - variety of support groups  336- 373-1402 Call for more information  °Narcotics Anonymous (NA), [facility] [address], °[place]  2 meetings at this location  ° °Residential Treatment Programs °Organization         Address  Phone  Notes  °[facility] [address],    °[place]  1-866-801-8205   °[facility] ° [address], [place], [place] 704-293-8524   °[facility] [address], [place] 336-845-3988 Admissions: 8am-3pm M-F  °[facility] [address]    °[place], [place] 336-841-1104   °[facility] [address], [place], [place] 336-379-7146   °[facility] [address]  °[place], [place] 336-285-9073   °[facility] Intensive Outpatient [address], [place], [place] 336-852-3033   °[facility] [address]  °[place], [place] 1-877-615-2722 or 336-784-9470   °[facility] [address] [place], [place] 336-227-7417 Accepts Medicaid  °[facility] [address]  °[place] 1-800-659-3381 Substance Abuse/Addiction Treatment  ° °[place] Behavioral Health Resources °Organization         Address  Phone  Notes  °[facility]  (888) 581-9988   °Julie Brannon, PhD [address] [place], [place]   (336) 349-5553 or (336) 951-0000   °[facility]   [address] °[place], [place] (336) 349-4454   °[facility] [address], [place], [place] (336) 342-8316  Insurance/Medicaid/sponsorship through [facility]  °[facility] [address]                                    [place], [place] (336) 342-8316 Therapy/tele-psych/case  °[facility] [address]  ° [place], [place] (336) 349-2233    °Dr. Arfeen  (336) 349-4544   °[facility] [place]  [employer] [place] [facility] 1) [address], [place] °2) [address], [place] °3)  [address], [place] (336) 349-3220 °(336) 342-7768 ° °(336) 342-8140   °[place] [facility] (336) 342-1394 or (336) 342-3537 (After Hours)    ° ° ° °

## 2013-12-21 NOTE — ED Provider Notes (Signed)
CSN: [id number]     Arrival date & time 12/21/13  1339 History   First MD Initiated Contact with Patient 12/21/13 1357     Chief Complaint  Patient presents with  . Panic Attack     (Consider location/radiation/quality/duration/timing/severity/associated sxs/prior Treatment) HPI Comments: Pt states that she has panic attack and she is requesting medication refill for xanax. This is an ongoing problem. Pt denies si/hi  The history is provided by the patient. No language interpreter was used.    Past Medical History  Diagnosis Date  . Anxiety   . Depression   . Bipolar affective disorder   . Panic attacks    Past Surgical History  Procedure Laterality Date  . Cesarean section      x 2  . Tubal ligation     History reviewed. No pertinent family history. History  Substance Use Topics  . Smoking status: Never Smoker   . Smokeless tobacco: Never Used  . 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   Respiratory: Negative.   Cardiovascular: Negative.       Allergies  Ibuprofen  Home Medications   Prior to Admission medications   Medication Sig Start Date End Date Taking? Authorizing Provider  ALPRAZolam Prudy Feeler(XANAX) 1 MG tablet Take 1 tablet (1 mg total) by mouth 3 (three) times daily as needed. 09/29/11   Toy BakerAnthony T Allen, MD  ALPRAZolam Prudy Feeler(XANAX) 1 MG tablet Take 1 tablet (1 mg total) by mouth 3 (three) times daily as needed for sleep or anxiety. 03/10/13   Derwood KaplanAnkit Nanavati, MD  ALPRAZolam Prudy Feeler(XANAX) 1 MG tablet Take 1 tablet (1 mg total) by mouth 3 (three) times daily as needed for anxiety. 10/19/13   Teressa LowerVrinda Genelda Roark, NP  QUEtiapine (SEROQUEL) 100 MG tablet Take 100 mg by mouth at bedtime.      Historical Provider, MD  traMADol (ULTRAM) 50 MG tablet Take 2 tablets (100 mg total) by mouth daily. 03/10/13   Derwood KaplanAnkit Nanavati, MD  traMADol  (ULTRAM-ER) 100 MG 24 hr tablet Take 100 mg by mouth daily.    Historical Provider, MD   BP 134/81  Pulse 86  Temp(Src) 98.5 F (36.9 C) (Oral)  Resp 16  Ht 5\' 1"  (1.549 m)  Wt 137 lb (62.143 kg)  BMI 25.90 kg/m2  SpO2 100%  LMP 11/23/2013 Physical Exam  Nursing note reviewed. Constitutional: She is oriented to person, place, and time. She appears well-developed and well-nourished.  Cardiovascular: Normal rate and regular rhythm.   Pulmonary/Chest: Effort normal and breath sounds normal.  Neurological: She is alert and oriented to person, place, and time.  Skin: Skin is warm and dry.  Psychiatric: Her behavior is normal.    ED Course  Procedures (including critical care time) Labs Review Labs Reviewed - No data to display  Imaging Review No results found.   EKG Interpretation None      MDM   Final diagnoses:  Anxiety    Pt has had multiple refill of medication according to the drug data base discussed with pt that she would not have them filled today    Teressa LowerVrinda Jozalyn Baglio, NP 12/21/13 1418

## 2015-01-06 ENCOUNTER — Encounter: Payer: Self-pay | Admitting: Emergency Medicine

## 2015-01-06 ENCOUNTER — Emergency Department
Admission: EM | Admit: 2015-01-06 | Discharge: 2015-01-06 | Disposition: A | Discharge status: Home / Self Care | Payer: Medicaid Other | Source: Home / Self Care | Attending: Emergency Medicine | Admitting: Emergency Medicine

## 2015-01-06 DIAGNOSIS — S29009A Unspecified injury of muscle and tendon of unspecified wall of thorax, initial encounter: Secondary | ICD-10-CM | POA: Diagnosis not present

## 2015-01-06 DIAGNOSIS — S29019A Strain of muscle and tendon of unspecified wall of thorax, initial encounter: Secondary | ICD-10-CM

## 2015-01-06 MED ORDER — TRAMADOL-ACETAMINOPHEN 37.5-325 MG PO TABS: ORAL_TABLET | ORAL | Status: DC

## 2015-01-06 NOTE — ED Notes (Signed)
Error: cannot take ibuprofen; Tylenol not working.

## 2015-01-06 NOTE — ED Provider Notes (Signed)
CSN: [id number]     Arrival date & time 01/06/15  1341 History   First MD Initiated Contact with Patient 01/06/15 1403     Chief Complaint  Patient presents with  . Back Pain   (Consider location/radiation/quality/duration/timing/severity/associated sxs/prior Treatment) HPI Reports lifting heavy furniture 3 days ago and felt middle back pain twinge; has gotten progressively worse; heat/ice not working, nor is ibuprofen. Mid upper back pain is sharp, 8 out of 10 intensity without radiation or paresthesias or focal weakness. No bowel or bladder dysfunction. No chest pain or shortness of breath or nausea or vomiting or abdominal pain. She denies chance of pregnancy as last menstrual period was 2 days ago. Past Medical History  Diagnosis Date  . Anxiety   . Depression   . Bipolar affective disorder   . Panic attacks    Past Surgical History  Procedure Laterality Date  . Cesarean section      x 2  . Tubal ligation     History reviewed. No pertinent family history. History  Substance Use Topics  . Smoking status: Never Smoker   . Smokeless tobacco: Never Used  . 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  Allergies  Ibuprofen  Home Medications   Prior to Admission medications   Medication Sig Start Date End Date Taking? Authorizing Provider  ALPRAZolam Prudy Feeler(XANAX) 1 MG tablet Take 1 tablet (1 mg total) by mouth 3 (three) times daily as needed for anxiety. 10/19/13   Teressa LowerVrinda Pickering, NP  QUEtiapine (SEROQUEL) 100 MG tablet Take 100 mg by mouth at bedtime.      Historical Provider, MD  traMADol-acetaminophen (ULTRACET) 37.5-325 MG per tablet 1 or 2 every 4-6 hours as needed for moderate-severe pain.  Caution: May cause drowsiness 01/06/15   Lajean Manesavid Massey, MD   BP 122/85 mmHg  Pulse 96  Temp(Src) 98.2 F (36.8 C) (Oral)   Resp 20  Wt 144 lb (65.318 kg)  SpO2 98% Physical Exam  Constitutional: She is oriented to person, place, and time. She appears well-developed and well-nourished. No distress.  HENT:  Head: Normocephalic and atraumatic.  Eyes: Conjunctivae and EOM are normal. Pupils are equal, round, and reactive to light. No scleral icterus.  Neck: Normal range of motion.  Cardiovascular: Normal rate.   Pulmonary/Chest: Effort normal.  Abdominal: She exhibits no distension.  Musculoskeletal: Normal range of motion.  Diffuse tenderness parathoracic muscles. No definite point tenderness over C-spine, thoracic spine, lumbar spine. The parathoracic muscle pain exacerbates by torsion and flexion and extension. Motor, sensory, DTRs upper and lower extremities equal and intact bilaterally.  Neurological: She is alert and oriented to person, place, and time.  Skin: Skin is warm.  Psychiatric: She has a normal mood and affect.  Nursing note and vitals reviewed.   ED Course  Procedures (including critical care time) Labs Review Labs Reviewed - No data to display  Imaging Review No results found.   MDM   1. Thoracic myofascial strain, initial encounter    Treatment options discussed, as well as risks, benefits, alternatives. Patient voiced understanding and agreement with the following plans: Heat, relative rest. Other symptomatic care. Discharge Medication List as of 01/06/2015  2:48 PM    START taking these medications   Details  traMADol-acetaminophen (ULTRACET) 37.5-325 MG per  tablet 1 or 2 every 4-6 hours as needed for moderate-severe pain.  Caution: May cause drowsiness, No Print       Follow-up with your primary care doctor in 5-7 days if not improving, or sooner if symptoms become worse. Precautions discussed. Red flags discussed. Questions invited and answered. Patient voiced understanding and agreement.     Lajean Manes, MD 01/06/15 1556

## 2015-01-06 NOTE — ED Notes (Signed)
Reports lifting heavy furniture 3 days ago and felt middle back pain twinge; has gotten progressively worse; heat/ice not working, nor is ibuprofen.

## 2015-01-11 ENCOUNTER — Telehealth: Payer: Self-pay | Admitting: *Deleted

## 2015-11-22 ENCOUNTER — Encounter: Payer: Self-pay | Admitting: *Deleted

## 2015-11-22 ENCOUNTER — Emergency Department
Admission: EM | Admit: 2015-11-22 | Discharge: 2015-11-22 | Disposition: A | Discharge status: Home / Self Care | Payer: Medicaid Other | Source: Home / Self Care | Attending: Family Medicine | Admitting: Family Medicine

## 2015-11-22 DIAGNOSIS — S29012A Strain of muscle and tendon of back wall of thorax, initial encounter: Secondary | ICD-10-CM | POA: Diagnosis not present

## 2015-11-22 MED ORDER — PREDNISONE 20 MG PO TABS: ORAL_TABLET | ORAL | Status: DC

## 2015-11-22 NOTE — ED Provider Notes (Signed)
CSN: [id number]     Arrival date & time 11/22/15  1332 History   First MD Initiated Contact with Patient 11/22/15 1349     Chief Complaint  Patient presents with  . Back Pain   (Consider location/radiation/quality/duration/timing/severity/associated sxs/prior Treatment) HPI  The pt is a 35yo female [facility] with c/o moderate to severe mid to upper back pain for 2 days.  Pt states pain started after pushing her lawn mower to cut grass. She had not done this in a while.  She has been taking ASA, Tylenol, and Flexeril w/o relief.  Denies numbness or tingling in arms or legs. Per Care Everywhere, pt was see in the emergency department for back pain and dysuria. She did not have a UTI.  Pt denies urinary symptoms now.    Past Medical History  Diagnosis Date  . Anxiety   . Depression   . Bipolar affective disorder (HCC)   . Panic attacks    Past Surgical History  Procedure Laterality Date  . Cesarean section      x 2  . Tubal ligation     Family History  Problem Relation Age of Onset  . Seizures Mother    Social History  Substance Use Topics  . Smoking status: Former Games developermoker  . Smokeless tobacco: Never Used  . Alcohol Use: No     Comment: former heavy drinker at times   OB History    No data available     Review of Systems  Constitutional: Negative for fever and chills.  Genitourinary: Negative for dysuria, frequency and flank pain.  Musculoskeletal: Positive for myalgias and back pain. Negative for joint swelling, arthralgias, gait problem, neck pain and neck stiffness.  Skin: Negative for rash and wound.    Allergies  Ibuprofen and Penicillins  Home Medications   Prior to Admission medications   Medication Sig Start Date End Date Taking? Authorizing Provider  predniSONE (DELTASONE) 20 MG tablet 3 tabs po daily x 3 days, then 2 tabs x 3 days, then 1.5 tabs x 3 days, then 1 tab x 3 days, then 0.5 tabs x 3 days 11/22/15   Junius FinnerErin O'Malley, PA-C   Meds Ordered and  Administered this Visit  Medications - No data to display  BP 134/84 mmHg  Pulse 106  Temp(Src) 98.3 F (36.8 C) (Oral)  Resp 16  Ht 5' (1.524 m)  Wt 147 lb (66.679 kg)  BMI 28.71 kg/m2  SpO2 97%  LMP 10/28/2015 No data found.   Physical Exam  Constitutional: She is oriented to person, place, and time. She appears well-developed and well-nourished.  HENT:  Head: Normocephalic and atraumatic.  Eyes: EOM are normal.  Neck: Normal range of motion. Neck supple.  No midline bone tenderness, no crepitus or step-offs.    Cardiovascular: Normal rate.   Pulmonary/Chest: Effort normal.  Musculoskeletal: Normal range of motion. She exhibits tenderness. She exhibits no edema.  No midline spinal tenderness. Tenderness to Left and Right thoracic paraspinal muscles between scapula.  Full ROM upper and lower extremities with 5/5 strength bilaterally.   Neurological: She is alert and oriented to person, place, and time.  Skin: Skin is warm and dry.  Psychiatric: She has a normal mood and affect. Her behavior is normal.  Nursing note and vitals reviewed.   ED Course  Procedures (including critical care time)  Labs Review Labs Reviewed - No data to display  Imaging Review No results found.   MDM   1. Strain of  muscle and tendon of back wall of thorax, initial encounter    Pt c/o back pain after mowing the lawn the other day. No falls.   No red flag symptoms.  Tenderness over muscles.   Rx: Robaxin (advised not to take with flexeril), prednisone. May also take acetaminophen and the aspirin. F/u with Sports Medicine in 1-2 weeks if not improving, sooner if worsening. Patient verbalized understanding and agreement with treatment plan.     Junius Finner, PA-C 11/22/15 1421

## 2015-11-22 NOTE — ED Notes (Signed)
Pt c/o severe mid to upper back pain x 2 days after push mowing her yard. She has taken ASA and Tylenol without relief.

## 2015-11-22 NOTE — Discharge Instructions (Signed)
You may continue to take Flexeril as prescribed for muscle spasms as well as take Tylenol and Aspirin per packaging details for pain.  Alternate cool and warm compresses and use exercises listed below to also help with back pain. Avoid heavy lifting or sudden bending or reaching to help reduce chance of re-injuring your back.  Muscle Strain A muscle strain (pulled muscle) happens when a muscle is stretched beyond normal length. It happens when a sudden, violent force stretches your muscle too far. Usually, a few of the fibers in your muscle are torn. Muscle strain is common in athletes. Recovery usually takes 1-2 weeks. Complete healing takes 5-6 weeks.  HOME CARE   Follow the PRICE method of treatment to help your injury get better. Do this the first 2-3 days after the injury:  Protect. Protect the muscle to keep it from getting injured again.  Rest. Limit your activity and rest the injured body part.  Ice. Put ice in a plastic bag. Place a towel between your skin and the bag. Then, apply the ice and leave it on from 15-20 minutes each hour. After the third day, switch to moist heat packs.  Compression. Use a splint or elastic bandage on the injured area for comfort. Do not put it on too tightly.  Elevate. Keep the injured body part above the level of your heart.  Only take medicine as told by your doctor.  Warm up before doing exercise to prevent future muscle strains. GET HELP IF:   You have more pain or puffiness (swelling) in the injured area.  You feel numbness, tingling, or notice a loss of strength in the injured area. MAKE SURE YOU:   Understand these instructions.  Will watch your condition.  Will get help right away if you are not doing well or get worse.   This information is not intended to replace advice given to you by your health care provider. Make sure you discuss any questions you have with your health care provider.   Document Released: 05/01/2008 Document  Revised: 05/13/2013 Document Reviewed: 02/19/2013 Elsevier Interactive Patient Education [employer]2016 [employer] Inc[employer]

## 2016-07-05 ENCOUNTER — Emergency Department
Admission: EM | Admit: 2016-07-05 | Discharge: 2016-07-05 | Disposition: A | Discharge status: Home / Self Care | Payer: Medicaid Other | Source: Home / Self Care | Attending: Family Medicine | Admitting: Family Medicine

## 2016-07-05 ENCOUNTER — Emergency Department (INDEPENDENT_AMBULATORY_CARE_PROVIDER_SITE_OTHER): Payer: Medicaid Other

## 2016-07-05 ENCOUNTER — Encounter: Payer: Self-pay | Admitting: Emergency Medicine

## 2016-07-05 DIAGNOSIS — M5134 Other intervertebral disc degeneration, thoracic region: Secondary | ICD-10-CM | POA: Diagnosis not present

## 2016-07-05 DIAGNOSIS — B9789 Other viral agents as the cause of diseases classified elsewhere: Secondary | ICD-10-CM

## 2016-07-05 DIAGNOSIS — S29012A Strain of muscle and tendon of back wall of thorax, initial encounter: Secondary | ICD-10-CM

## 2016-07-05 DIAGNOSIS — J069 Acute upper respiratory infection, unspecified: Secondary | ICD-10-CM

## 2016-07-05 MED ORDER — PREDNISONE 20 MG PO TABS: ORAL_TABLET | ORAL | 0 refills | Status: AC

## 2016-07-05 MED ORDER — KETOROLAC TROMETHAMINE 60 MG/2ML IJ SOLN
60.0000 mg | Freq: Once | INTRAMUSCULAR | Status: AC
  Administered 2016-07-05: 60 mg via INTRAMUSCULAR

## 2016-07-05 MED ORDER — HYDROCODONE-ACETAMINOPHEN 5-325 MG PO TABS: ORAL_TABLET | ORAL | 0 refills | Status: AC

## 2016-07-05 MED ORDER — CYCLOBENZAPRINE HCL 10 MG PO TABS: 10.0000 mg | ORAL_TABLET | Freq: Every day | ORAL | 0 refills | Status: AC

## 2016-07-05 NOTE — ED Triage Notes (Signed)
Mid back pain after raking leaves 2 days ago, chills. Denies fever, urinary sx.

## 2016-07-05 NOTE — ED Provider Notes (Signed)
[facility]    CSN: [id number] Arrival date & time: 07/05/16  1501     History   Chief Complaint Chief Complaint  Patient presents with  . Back Pain    HPI Christy Baker is a 35 y.o. female.   Patient developed a URI about one week ago and now has increased posterior chest pain.  She continues to have chills.  Two days ago she raked leaves for about 20 minutes, resulting in even more posterior chest pain.  No shortness of breath.    The history is provided by the patient.  Back Pain  Location:  Thoracic spine Quality:  Aching Radiates to:  Does not radiate Pain severity:  Moderate Pain is:  Same all the time Onset quality:  Gradual Duration:  1 week Timing:  Constant Progression:  Worsening Chronicity:  New Context: recent illness   Context comment:  Raking leaves Relieved by:  Nothing Worsened by:  Coughing, deep breathing and movement Ineffective treatments:  OTC medications Associated symptoms: no abdominal pain, no dysuria, no fever, no numbness, no paresthesias and no weakness     Past Medical History:  Diagnosis Date  . Anxiety   . Bipolar affective disorder (HCC)   . Depression   . Panic attacks     There are no active problems to display for this patient.   Past Surgical History:  Procedure Laterality Date  . CESAREAN SECTION     x 2  . TUBAL LIGATION      OB History    No data available       Home Medications    Prior to Admission medications   Medication Sig Start Date End Date Taking? Authorizing Provider  acetaminophen (TYLENOL) 500 MG chewable tablet Chew 500 mg by mouth every 6 (six) hours as needed for pain.   Yes Historical Provider, MD  cyclobenzaprine (FLEXERIL) 10 MG tablet Take 1 tablet (10 mg total) by mouth at bedtime. 07/05/16   Lattie Haw, MD  HYDROcodone-acetaminophen (NORCO/VICODIN) 5-325 MG tablet Take one by mouth at bedtime as needed for pain 07/05/16   Lattie Haw, MD  predniSONE (DELTASONE)  20 MG tablet Take one tab by mouth twice daily for 4 days, then one daily for 3 days. Take with food. 07/05/16   Lattie Haw, MD    Family History Family History  Problem Relation Age of Onset  . Seizures Mother     Social History Social History  Substance Use Topics  . Smoking status: Former Games developer  . Smokeless tobacco: Never Used  . Alcohol use No     Comment: former heavy drinker at times     Allergies   Ibuprofen and Penicillins   Review of Systems Review of Systems  Constitutional: Negative for fever.  Gastrointestinal: Negative for abdominal pain.  Genitourinary: Negative for dysuria.  Musculoskeletal: Positive for back pain.  Neurological: Negative for weakness, numbness and paresthesias.  All other systems reviewed and are negative.    Physical Exam Triage Vital Signs ED Triage Vitals  Enc Vitals Group     BP 07/05/16 1528 120/77     Pulse Rate 07/05/16 1528 92     Resp --      Temp 07/05/16 1528 97.8 F (36.6 C)     Temp Source 07/05/16 1528 Oral     SpO2 07/05/16 1528 98 %     Weight 07/05/16 1529 144 lb (65.3 kg)     Height 07/05/16 1529 4\' 11"  (  1.499 m)     Head Circumference --      Peak Flow --      Pain Score 07/05/16 1531 8     Pain Loc --      Pain Edu? --      Excl. in GC? --    No data found.   Updated Vital Signs BP 120/77 (BP Location: Left Arm)   Pulse 92   Temp 97.8 F (36.6 C) (Oral)   Ht 4\' 11"  (1.499 m)   Wt 144 lb (65.3 kg)   LMP 06/16/2016   SpO2 98%   BMI 29.08 kg/m   Visual Acuity Right Eye Distance:   Left Eye Distance:   Bilateral Distance:    Right Eye Near:   Left Eye Near:    Bilateral Near:     Physical Exam  Constitutional: She appears well-developed and well-nourished. No distress.  HENT:  Head: Normocephalic.  Right Ear: External ear normal.  Left Ear: External ear normal.  Nose: Nose normal.  Mouth/Throat: Oropharynx is clear and moist.  Eyes: Conjunctivae are normal. Pupils are equal,  round, and reactive to light.  Neck: Normal range of motion. Neck supple.  Cardiovascular: Normal heart sounds.   Pulmonary/Chest: Breath sounds normal.      There is distinct tenderness over medial and inferior edges of both scapulae.  Pain elicited by resisted abduction of both shoulders while palpating the rhomboid muscles.   Abdominal: There is no tenderness.  Musculoskeletal: She exhibits no edema.  Lymphadenopathy:    She has no cervical adenopathy.  Skin: Skin is warm and dry. No rash noted. She is not diaphoretic.  Nursing note and vitals reviewed.    UC Treatments / Results  Labs (all labs ordered are listed, but only abnormal results are displayed) Labs Reviewed - No data to display  EKG  EKG Interpretation None       Radiology Dg Chest 2 View  Result Date: 07/05/2016 CLINICAL DATA:  Nonproductive cough for the past week associated with increasing posterior chest discomfort. EXAM: CHEST  2 VIEW COMPARISON:  PA and lateral chest x-ray of July 04, 2009 FINDINGS: The lungs are adequately inflated and clear. There is no pneumothorax or pleural effusion. The heart and mediastinal structures are normal. There is no pulmonary vascular congestion. The trachea is midline. The bony thorax exhibits mild degenerative disc disease centered in the mid thoracic spine. IMPRESSION: There is no active cardiopulmonary disease. 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 Electronically Signed   By: David  SwazilandJordan M.D.   On: 07/05/2016 16:28    Procedures Procedures (including critical care time)  Medications Ordered in UC Medications  ketorolac (TORADOL) injection 60 mg (not administered)     Initial Impression / Assessment and Plan / UC Course  I have reviewed the triage vital signs and the nursing notes.  Pertinent labs & imaging results that were available during my care of the patient were reviewed by me and considered in my medical decision making  (see chart for details).  Clinical Course   Administered Toradol 60mg  IM  Rx for Flexeril 10mg  [facility], and Lortab at HS prn. Begin prednisone burst/taper Friday 07/06/16. Apply ice pack for 20 to 30 minutes, 3 to 4 times daily  Continue until pain and swelling decrease. Begin back stretching and range of motion exercises as tolerated. Followup with PCP if not improved in 3 weeks.  For cold symptoms, try the following: Take plain guaifenesin (1200mg  extended  release tabs such as Mucinex) twice daily, with plenty of water, for cough and congestion.  May add Pseudoephedrine (30mg , one or two every 4 to 6 hours) for sinus congestion.  Get adequate rest.   May use Afrin nasal spray (or generic oxymetazoline) twice daily for about 5 days and then discontinue.  Also recommend using saline nasal spray several times daily and saline nasal irrigation (AYR is a common brand).  Use Flonase nasal spray each morning after using Afrin nasal spray and saline nasal irrigation. Try warm salt water gargles for sore throat.  Stop all antihistamines for now, and other non-prescription cough/cold preparations.   Follow-up with family doctor if not improving about10 days.      Final Clinical Impressions(s) / UC Diagnoses   Final diagnoses:  Rhomboid muscle strain, initial encounter  Viral URI with cough    New Prescriptions New Prescriptions   CYCLOBENZAPRINE (FLEXERIL) 10 MG TABLET    Take 1 tablet (10 mg total) by mouth at bedtime.   HYDROCODONE-ACETAMINOPHEN (NORCO/VICODIN) 5-325 MG TABLET    Take one by mouth at bedtime as needed for pain   PREDNISONE (DELTASONE) 20 MG TABLET    Take one tab by mouth twice daily for 4 days, then one daily for 3 days. Take with food.     Lattie HawStephen A Naziyah Tieszen, MD 07/12/16 1452

## 2016-07-05 NOTE — Discharge Instructions (Signed)
Begin prednisone Friday 07/06/16. Apply ice pack for 20 to 30 minutes, 3 to 4 times daily  Continue until pain and swelling decrease. Begin back stretching and range of motion exercises as tolerated.  For cold symptoms, try the following: Take plain guaifenesin (1200mg  extended release tabs such as Mucinex) twice daily, with plenty of water, for cough and congestion.  May add Pseudoephedrine (30mg , one or two every 4 to 6 hours) for sinus congestion.  Get adequate rest.   May use Afrin nasal spray (or generic oxymetazoline) twice daily for about 5 days and then discontinue.  Also recommend using saline nasal spray several times daily and saline nasal irrigation (AYR is a common brand).  Use Flonase nasal spray each morning after using Afrin nasal spray and saline nasal irrigation. Try warm salt water gargles for sore throat.  Stop all antihistamines for now, and other non-prescription cough/cold preparations.  Follow-up with family doctor if not improving about10 days.

## 2017-11-04 IMAGING — DX DG CHEST 2V
2 series · 2 of 2 positions shown · non-contrast
Comparison: PA and lateral chest x-ray July 04, 2009

CLINICAL DATA: Nonproductive cough for the past week associated
with increasing posterior chest discomfort.

EXAM:
CHEST  2 VIEW

[chest pa]
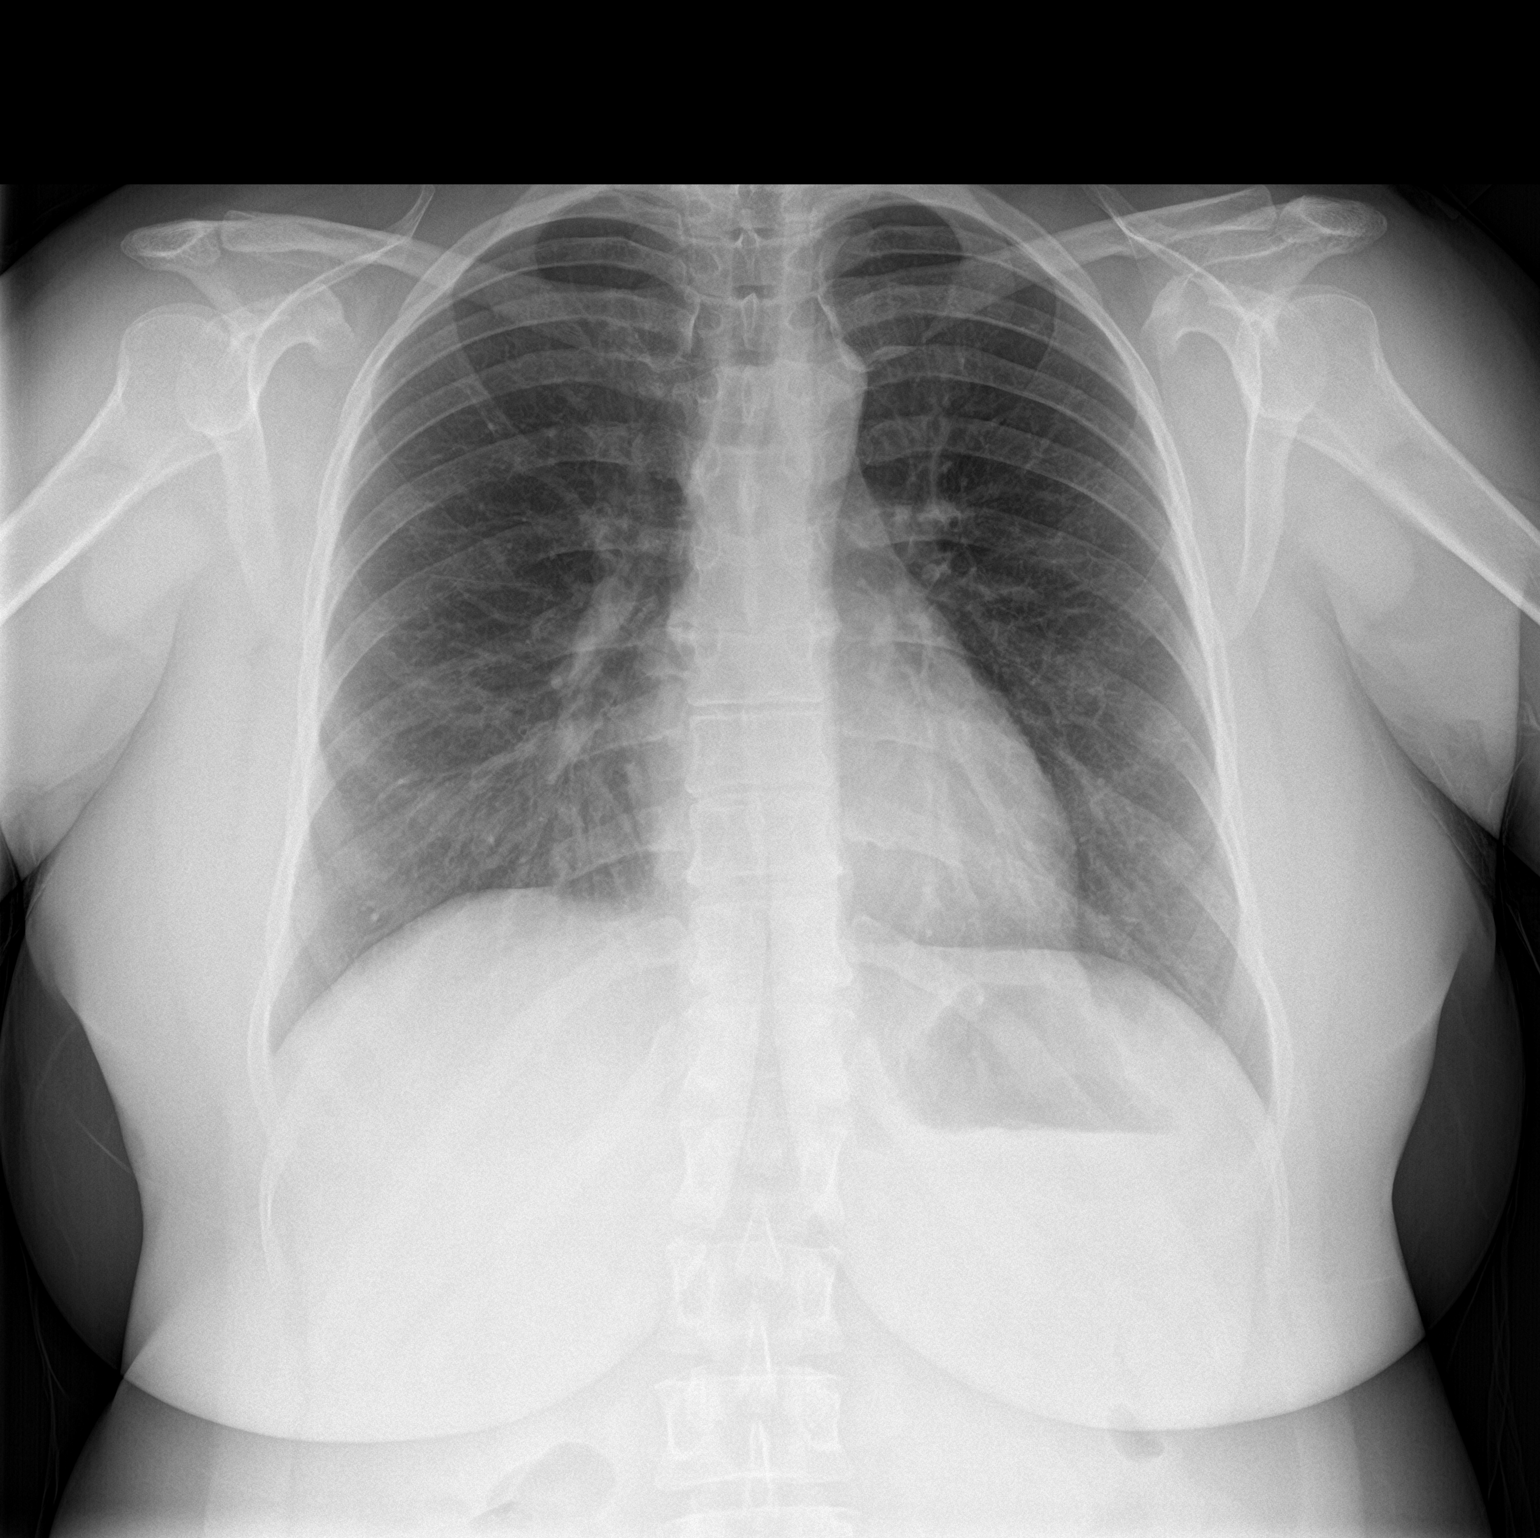

[chest lat]
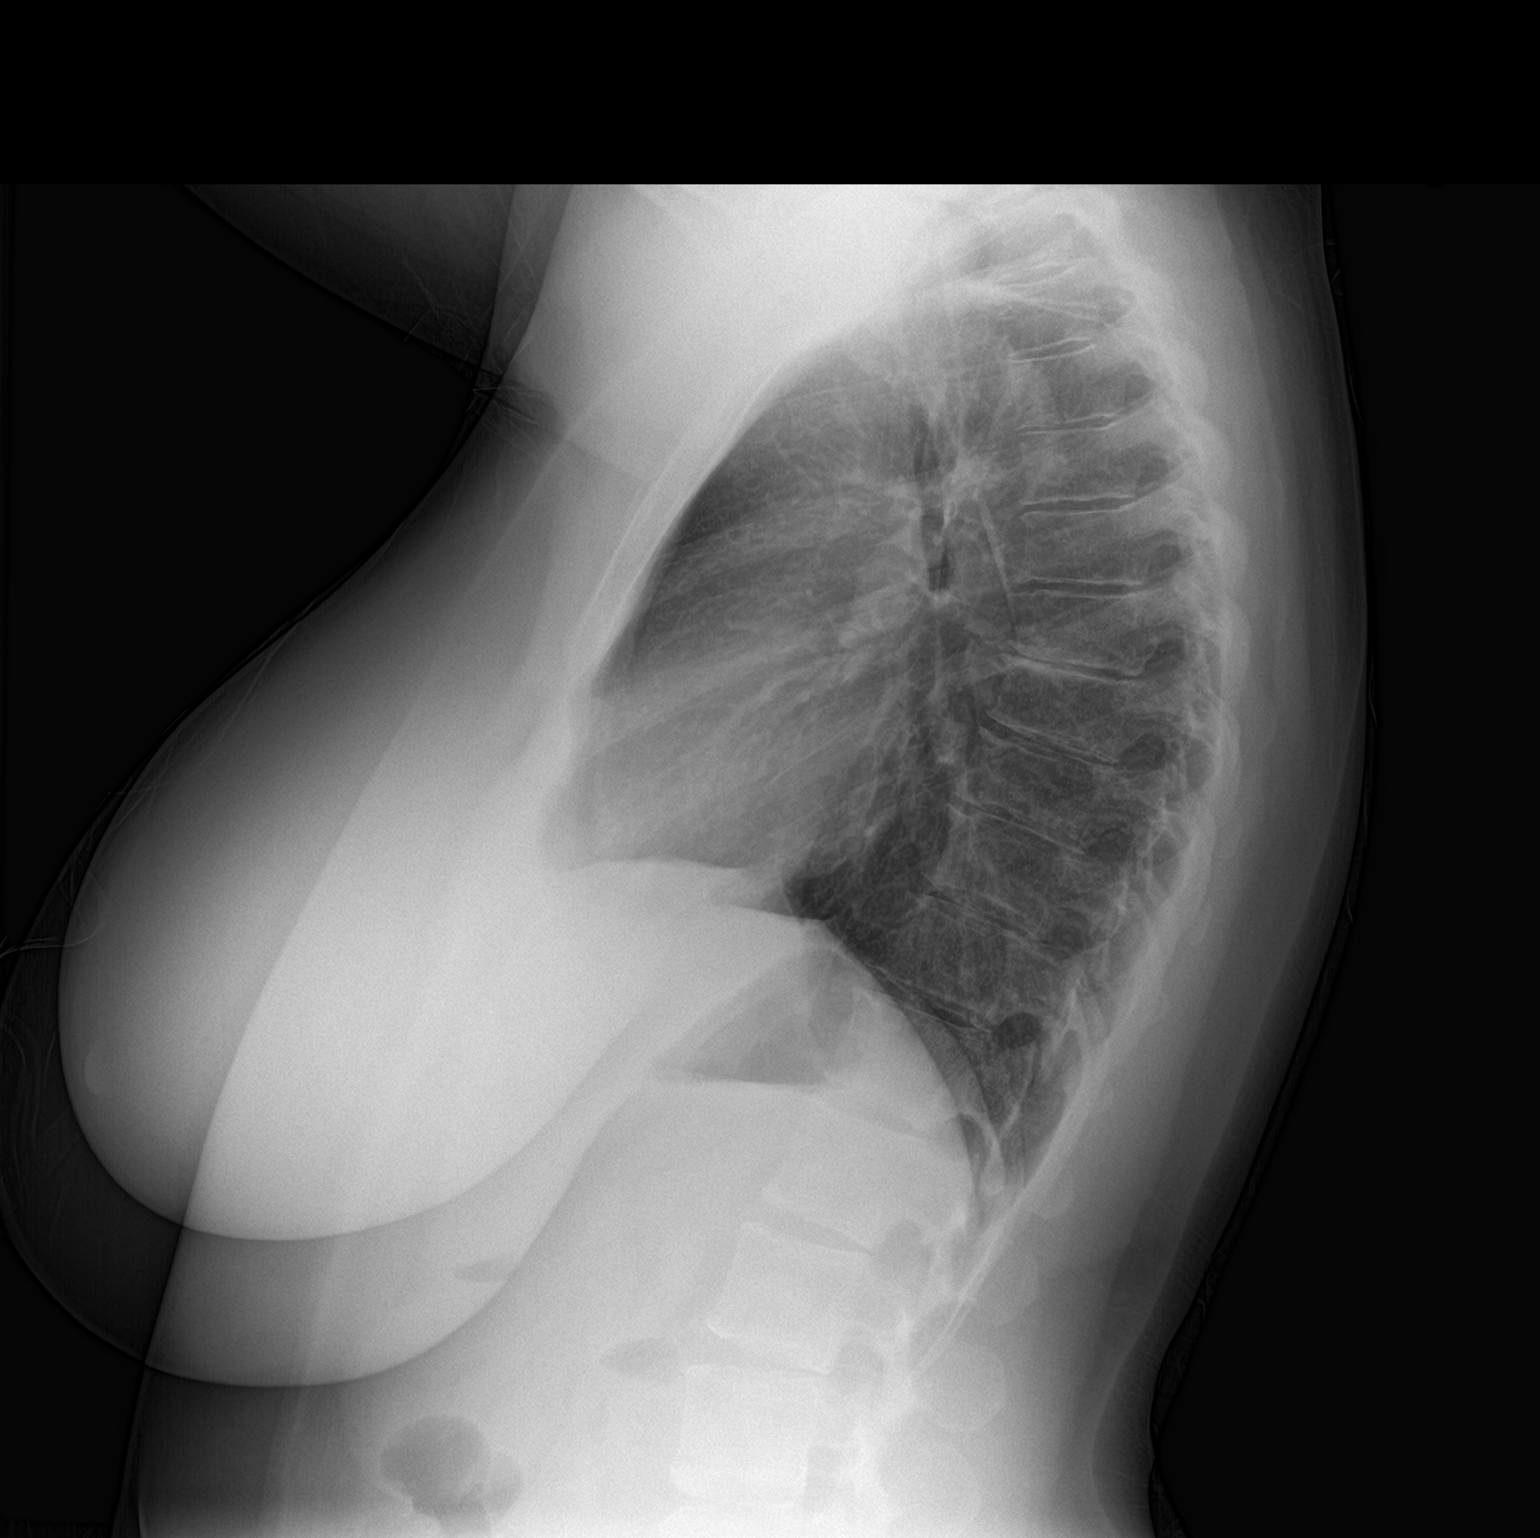

[2 of 2 positions shown; findings below may reference images not displayed]

FINDINGS: The lungs are adequately inflated and clear. There is no
pneumothorax or pleural effusion. The heart and mediastinal
structures are normal. There is no pulmonary vascular congestion.
The trachea is midline. The bony thorax exhibits mild degenerative
disc disease centered in the mid thoracic spine.
IMPRESSION: There is no active cardiopulmonary disease.

Mild degenerative disc disease centered in the mid thoracic spine.
